# Patient Record
Sex: Female | Born: 1955 | Race: White | Hispanic: No | Marital: Single | Smoking: Never smoker
Health system: Southern US, Community
[De-identification: ages and names within clinical notes are randomized; demographics above are authoritative.]

## PROBLEM LIST (undated history)

## (undated) DIAGNOSIS — I10 Essential (primary) hypertension: Secondary | ICD-10-CM

## (undated) DIAGNOSIS — G43909 Migraine, unspecified, not intractable, without status migrainosus: Secondary | ICD-10-CM

## (undated) HISTORY — PX: OTHER SURGICAL HISTORY: SHX169

## (undated) HISTORY — PX: TUBAL LIGATION: SHX77

---

## 2014-11-22 ENCOUNTER — Emergency Department (HOSPITAL_COMMUNITY)
Admission: EM | Admit: 2014-11-22 | Discharge: 2014-11-22 | Disposition: A | Discharge status: Home / Self Care | Payer: PRIVATE HEALTH INSURANCE | Attending: Emergency Medicine | Admitting: Emergency Medicine

## 2014-11-22 ENCOUNTER — Emergency Department (HOSPITAL_COMMUNITY): Payer: PRIVATE HEALTH INSURANCE

## 2014-11-22 ENCOUNTER — Encounter (HOSPITAL_COMMUNITY): Payer: Self-pay | Admitting: Emergency Medicine

## 2014-11-22 DIAGNOSIS — R109 Unspecified abdominal pain: Secondary | ICD-10-CM | POA: Diagnosis not present

## 2014-11-22 DIAGNOSIS — Z7982 Long term (current) use of aspirin: Secondary | ICD-10-CM | POA: Diagnosis not present

## 2014-11-22 DIAGNOSIS — H9202 Otalgia, left ear: Secondary | ICD-10-CM | POA: Insufficient documentation

## 2014-11-22 DIAGNOSIS — I10 Essential (primary) hypertension: Secondary | ICD-10-CM | POA: Insufficient documentation

## 2014-11-22 DIAGNOSIS — M2550 Pain in unspecified joint: Secondary | ICD-10-CM | POA: Diagnosis not present

## 2014-11-22 DIAGNOSIS — Z905 Acquired absence of kidney: Secondary | ICD-10-CM | POA: Insufficient documentation

## 2014-11-22 DIAGNOSIS — R110 Nausea: Secondary | ICD-10-CM | POA: Insufficient documentation

## 2014-11-22 DIAGNOSIS — G43909 Migraine, unspecified, not intractable, without status migrainosus: Secondary | ICD-10-CM | POA: Diagnosis present

## 2014-11-22 DIAGNOSIS — R42 Dizziness and giddiness: Secondary | ICD-10-CM | POA: Diagnosis not present

## 2014-11-22 DIAGNOSIS — Z85528 Personal history of other malignant neoplasm of kidney: Secondary | ICD-10-CM | POA: Insufficient documentation

## 2014-11-22 DIAGNOSIS — M7989 Other specified soft tissue disorders: Secondary | ICD-10-CM | POA: Diagnosis not present

## 2014-11-22 DIAGNOSIS — R197 Diarrhea, unspecified: Secondary | ICD-10-CM | POA: Diagnosis not present

## 2014-11-22 DIAGNOSIS — Z79899 Other long term (current) drug therapy: Secondary | ICD-10-CM | POA: Insufficient documentation

## 2014-11-22 HISTORY — DX: Migraine, unspecified, not intractable, without status migrainosus: G43.909

## 2014-11-22 HISTORY — DX: Essential (primary) hypertension: I10

## 2014-11-22 LAB — BASIC METABOLIC PANEL
Anion gap: 10 (ref 5–15)
BUN: 19 mg/dL (ref 6–20)
CO2: 26 mmol/L (ref 22–32)
Calcium: 9.3 mg/dL (ref 8.9–10.3)
Chloride: 105 mmol/L (ref 101–111)
Creatinine, Ser: 1.1 mg/dL — ABNORMAL HIGH (ref 0.44–1.00)
GFR calc Af Amer: >60 mL/min (ref >60)
GFR calc non Af Amer: 54 mL/min — ABNORMAL LOW (ref >60)
Glucose, Bld: 100 mg/dL — ABNORMAL HIGH (ref 65–99)
Potassium: 4.1 mmol/L (ref 3.5–5.1)
Sodium: 141 mmol/L (ref 135–145)

## 2014-11-22 LAB — I-STAT TROPONIN, ED: Troponin i, poc: 0.01 ng/mL (ref 0.00–0.08)

## 2014-11-22 LAB — CBC
HCT: 42.7 % (ref 36.0–46.0)
Hemoglobin: 13.3 g/dL (ref 12.0–15.0)
MCH: 26.9 pg (ref 26.0–34.0)
MCHC: 31.1 g/dL (ref 30.0–36.0)
MCV: 86.4 fL (ref 78.0–100.0)
Platelets: 287 10*3/uL (ref 150–400)
RBC: 4.94 MIL/uL (ref 3.87–5.11)
RDW: 15.6 % — ABNORMAL HIGH (ref 11.5–15.5)
WBC: 10 10*3/uL (ref 4.0–10.5)

## 2014-11-22 LAB — CBG MONITORING, ED: Glucose-Capillary: 107 mg/dL — ABNORMAL HIGH (ref 65–99)

## 2014-11-22 MED ORDER — METOCLOPRAMIDE HCL 10 MG PO TABS: 10.0000 mg | ORAL_TABLET | Freq: Four times a day (QID) | ORAL | Status: AC | PRN

## 2014-11-22 MED ORDER — DIPHENHYDRAMINE HCL 25 MG PO CAPS: 25.0000 mg | ORAL_CAPSULE | Freq: Four times a day (QID) | ORAL | Status: AC | PRN

## 2014-11-22 MED ORDER — DIPHENHYDRAMINE HCL 25 MG PO CAPS
25.0000 mg | ORAL_CAPSULE | Freq: Once | ORAL | Status: AC
  Administered 2014-11-22: 25 mg via ORAL
  Filled 2014-11-22: qty 1

## 2014-11-22 MED ORDER — METOCLOPRAMIDE HCL 5 MG/ML IJ SOLN
10.0000 mg | Freq: Once | INTRAMUSCULAR | Status: AC
  Administered 2014-11-22: 10 mg via INTRAMUSCULAR
  Filled 2014-11-22: qty 2

## 2014-11-22 MED ORDER — KETOROLAC TROMETHAMINE 30 MG/ML IJ SOLN
30.0000 mg | Freq: Once | INTRAMUSCULAR | Status: AC
  Administered 2014-11-22: 30 mg via INTRAMUSCULAR
  Filled 2014-11-22: qty 1

## 2014-11-22 NOTE — Discharge Instructions (Signed)
Headaches, Frequently Asked Questions °MIGRAINE HEADACHES °Q: What is migraine? What causes it? How can I treat it? °A: Generally, migraine headaches begin as a dull ache. Then they develop into a constant, throbbing, and pulsating pain. You may experience pain at the temples. You may experience pain at the front or back of one or both sides of the head. The pain is usually accompanied by a combination of: °· Nausea. °· Vomiting. °· Sensitivity to light and noise. °Some people (about 15%) experience an aura (see below) before an attack. The cause of migraine is believed to be chemical reactions in the brain. Treatment for migraine may include over-the-counter or prescription medications. It may also include self-help techniques. These include relaxation training and biofeedback.  °Q: What is an aura? °A: About 15% of people with migraine get an "aura". This is a sign of neurological symptoms that occur before a migraine headache. You may see wavy or jagged lines, dots, or flashing lights. You might experience tunnel vision or blind spots in one or both eyes. The aura can include visual or auditory hallucinations (something imagined). It may include disruptions in smell (such as strange odors), taste or touch. Other symptoms include: °· Numbness. °· A "pins and needles" sensation. °· Difficulty in recalling or speaking the correct word. °These neurological events may last as long as 60 minutes. These symptoms will fade as the headache begins. °Q: What is a trigger? °A: Certain physical or environmental factors can lead to or "trigger" a migraine. These include: °· Foods. °· Hormonal changes. °· Weather. °· Stress. °It is important to remember that triggers are different for everyone. To help prevent migraine attacks, you need to figure out which triggers affect you. Keep a headache diary. This is a good way to track triggers. The diary will help you talk to your healthcare professional about your condition. °Q: Does  weather affect migraines? °A: Bright sunshine, hot, humid conditions, and drastic changes in barometric pressure may lead to, or "trigger," a migraine attack in some people. But studies have shown that weather does not act as a trigger for everyone with migraines. °Q: What is the link between migraine and hormones? °A: Hormones start and regulate many of your body's functions. Hormones keep your body in balance within a constantly changing environment. The levels of hormones in your body are unbalanced at times. Examples are during menstruation, pregnancy, or menopause. That can lead to a migraine attack. In fact, about three quarters of all women with migraine report that their attacks are related to the menstrual cycle.  °Q: Is there an increased risk of stroke for migraine sufferers? °A: The likelihood of a migraine attack causing a stroke is very remote. That is not to say that migraine sufferers cannot have a stroke associated with their migraines. In persons under age 40, the most common associated factor for stroke is migraine headache. But over the course of a person's normal life span, the occurrence of migraine headache may actually be associated with a reduced risk of dying from cerebrovascular disease due to stroke.  °Q: What are acute medications for migraine? °A: Acute medications are used to treat the pain of the headache after it has started. Examples over-the-counter medications, NSAIDs, ergots, and triptans.  °Q: What are the triptans? °A: Triptans are the newest class of abortive medications. They are specifically targeted to treat migraine. Triptans are vasoconstrictors. They moderate some chemical reactions in the brain. The triptans work on receptors in your brain. Triptans help   to restore the balance of a neurotransmitter called serotonin. Fluctuations in levels of serotonin are thought to be a main cause of migraine.  °Q: Are over-the-counter medications for migraine effective? °A:  Over-the-counter, or "OTC," medications may be effective in relieving mild to moderate pain and associated symptoms of migraine. But you should see your caregiver before beginning any treatment regimen for migraine.  °Q: What are preventive medications for migraine? °A: Preventive medications for migraine are sometimes referred to as "prophylactic" treatments. They are used to reduce the frequency, severity, and length of migraine attacks. Examples of preventive medications include antiepileptic medications, antidepressants, beta-blockers, calcium channel blockers, and NSAIDs (nonsteroidal anti-inflammatory drugs). °Q: Why are anticonvulsants used to treat migraine? °A: During the past few years, there has been an increased interest in antiepileptic drugs for the prevention of migraine. They are sometimes referred to as "anticonvulsants". Both epilepsy and migraine may be caused by similar reactions in the brain.  °Q: Why are antidepressants used to treat migraine? °A: Antidepressants are typically used to treat people with depression. They may reduce migraine frequency by regulating chemical levels, such as serotonin, in the brain.  °Q: What alternative therapies are used to treat migraine? °A: The term "alternative therapies" is often used to describe treatments considered outside the scope of conventional Western medicine. Examples of alternative therapy include acupuncture, acupressure, and yoga. Another common alternative treatment is herbal therapy. Some herbs are believed to relieve headache pain. Always discuss alternative therapies with your caregiver before proceeding. Some herbal products contain arsenic and other toxins. °TENSION HEADACHES °Q: What is a tension-type headache? What causes it? How can I treat it? °A: Tension-type headaches occur randomly. They are often the result of temporary stress, anxiety, fatigue, or anger. Symptoms include soreness in your temples, a tightening band-like sensation  around your head (a "vice-like" ache). Symptoms can also include a pulling feeling, pressure sensations, and contracting head and neck muscles. The headache begins in your forehead, temples, or the back of your head and neck. Treatment for tension-type headache may include over-the-counter or prescription medications. Treatment may also include self-help techniques such as relaxation training and biofeedback. °CLUSTER HEADACHES °Q: What is a cluster headache? What causes it? How can I treat it? °A: Cluster headache gets its name because the attacks come in groups. The pain arrives with little, if any, warning. It is usually on one side of the head. A tearing or bloodshot eye and a runny nose on the same side of the headache may also accompany the pain. Cluster headaches are believed to be caused by chemical reactions in the brain. They have been described as the most severe and intense of any headache type. Treatment for cluster headache includes prescription medication and oxygen. °SINUS HEADACHES °Q: What is a sinus headache? What causes it? How can I treat it? °A: When a cavity in the bones of the face and skull (a sinus) becomes inflamed, the inflammation will cause localized pain. This condition is usually the result of an allergic reaction, a tumor, or an infection. If your headache is caused by a sinus blockage, such as an infection, you will probably have a fever. An x-ray will confirm a sinus blockage. Your caregiver's treatment might include antibiotics for the infection, as well as antihistamines or decongestants.  °REBOUND HEADACHES °Q: What is a rebound headache? What causes it? How can I treat it? °A: A pattern of taking acute headache medications too often can lead to a condition known as "rebound headache."   A pattern of taking too much headache medication includes taking it more than 2 days per week or in excessive amounts. That means more than the label or a caregiver advises. With rebound  headaches, your medications not only stop relieving pain, they actually begin to cause headaches. Doctors treat rebound headache by tapering the medication that is being overused. Sometimes your caregiver will gradually substitute a different type of treatment or medication. Stopping may be a challenge. Regularly overusing a medication increases the potential for serious side effects. Consult a caregiver if you regularly use headache medications more than 2 days per week or more than the label advises. ADDITIONAL QUESTIONS AND ANSWERS Q: What is biofeedback? A: Biofeedback is a self-help treatment. Biofeedback uses special equipment to monitor your body's involuntary physical responses. Biofeedback monitors:  Breathing.  Pulse.  Heart rate.  Temperature.  Muscle tension.  Brain activity. Biofeedback helps you refine and perfect your relaxation exercises. You learn to control the physical responses that are related to stress. Once the technique has been mastered, you do not need the equipment any more. Q: Are headaches hereditary? A: Four out of five (80%) of people that suffer report a family history of migraine. Scientists are not sure if this is genetic or a family predisposition. Despite the uncertainty, a child has a 50% chance of having migraine if one parent suffers. The child has a 75% chance if both parents suffer.  Q: Can children get headaches? A: By the time they reach high school, most young people have experienced some type of headache. Many safe and effective approaches or medications can prevent a headache from occurring or stop it after it has begun.  Q: What type of doctor should I see to diagnose and treat my headache? A: Start with your primary caregiver. Discuss his or her experience and approach to headaches. Discuss methods of classification, diagnosis, and treatment. Your caregiver may decide to recommend you to a headache specialist, depending upon your symptoms or other  physical conditions. Having diabetes, allergies, etc., may require a more comprehensive and inclusive approach to your headache. The National Headache Foundation will provide, upon request, a list of [facility] physician members in your state. Document Released: 08/21/2003 Document Revised: 08/23/2011 Document Reviewed: 01/29/2008 [facility] Patient Information 2015 [place], [place]. This information is not intended to replace advice given to you by your health care provider. Make sure you discuss any questions you have with your health care provider.  Emergency Department Resource Guide 1) Find a Doctor and Pay Out of Pocket Although you won't have to find out who is covered by your insurance plan, it is a good idea to ask around and get recommendations. You will then need to call the office and see if the doctor you have chosen will accept you as a new patient and what types of options they offer for patients who are self-pay. Some doctors offer discounts or will set up payment plans for their patients who do not have insurance, but you will need to ask so you aren't surprised when you get to your appointment.  2) Contact Your Local Health Department Not all health departments have doctors that can see patients for sick visits, but many do, so it is worth a call to see if yours does. If you don't know where your local health department is, you can check in your phone book. The CDC also has a tool to help you locate your state's health department, and many state websites also have  listings of all of their local health departments.  3) Find a Walk-in Clinic If your illness is not likely to be very severe or complicated, you may want to try a walk in clinic. These are popping up all over the country in pharmacies, drugstores, and shopping centers. They're usually staffed by nurse practitioners or physician assistants that have been trained to treat common illnesses and complaints. They're usually fairly quick and  inexpensive. However, if you have serious medical issues or chronic medical problems, these are probably not your best option.  No Primary Care Doctor: - Call Health Connect at  [phone] - they can help you locate a primary care doctor that  accepts your insurance, provides certain services, etc. - Physician Referral Service- [phone]  Chronic Pain Problems: Organization         Address  Phone   Notes  [facility]  [phone] Patients need to be referred by their primary care doctor.   Medication Assistance: Organization         Address  Phone   Notes  [facility] Medication [facility] [address] [place] [place], [place] [address] [phone] --Must be a resident of [facility] -- Must have NO insurance coverage whatsoever (no Medicaid/ Medicare, etc.) -- The pt. MUST have a primary care doctor that directs their care regularly and follows them in the community   [facility]  [phone]   [employer]  [phone]    Agencies that provide inexpensive medical care: Organization         Address  Phone   Notes  [facility]  [phone]   [facility]    [phone]   [facility] [address] [place], [place] [address] [phone]   [facility] [address], [place] [phone]   [facility]    [phone]   [facility]    [phone]   [facility]  [address], [place] Phone:  [phone], Fax:  [phone] Hours of Operation:  9 am - 6 pm, M-F.  Also accepts Medicaid/Medicare and self-pay.  [facility]  [address], [place] Phone: [phone], Fax: [phone]. Hours of Operation:  8:30 am - 5:30 pm, M-F.  Also accepts Medicaid and self-pay.  [facility] [place] [address], [place] Phone: [phone]   [facility] [address] Natasha Bence [place], [place] [phone], Ext. [phone] Mondays & Thursdays: 7-9 AM.  First 15 patients are seen on a first come, first serve basis.    Medicaid-accepting [facility] Providers:  Organization         Address  Phone   Notes  [facility] [address], [place] [phone] Also accepts self-pay patients.  [facility] [address] Laurell Josephs [place], [place]  [phone]   [facility] [address], [place] [phone]   [facility] [address] [place] [phone]   Renaye Rakers [address], [place]   [phone] Only accepts [place] patients after they have their name applied to their card.   Self-Pay (no insurance) in [facility]:  [employer]  Notes  °Sickle Cell Patients, [facility] [address], [place] [phone]   °[facility] [address], [place] ([phone]) [phone]   °[facility] ° [address], [place] ([phone]) [phone]   °[facility]/Dr. Osei-Bonsu ° [address] [place] [address], [place] or [address], [place] [phone] Phone number for both [place] and [place] locations is the same.  °[facility] [address], [place] ([phone]) [phone]   °[facility] [address] [place] [address], [place] or [address] ([phone]) [phone] °([phone]) [phone]   °[facility] [address], [place] ([phone]) [phone], phone; [phone], fax Sees patients 1st and 3rd Saturday of every month.  Must not qualify for public or private insurance (i.e. Medicaid, Medicare, [place] Health Choice, Veterans' Benefits) • Household income should be no more than 200% of the poverty level •The clinic cannot treat you if you are pregnant or  think you are pregnant • Sexually transmitted diseases are not treated at the clinic.  ° ° °Dental Care: °Organization         Address  Phone  Notes  °[facility] [address], [place] ([phone]) [phone] Accepts children up to age 21 who are enrolled in Medicaid or [place] Health Choice; pregnant women with a Medicaid card; and children who have applied for Medicaid or [place] Health Choice, but were declined, whose parents can pay a reduced fee at time of service.  °[facility] [place]  [address], [place] ([phone]) [phone] Accepts children up to age 21 who are enrolled in Medicaid or [place] Health Choice; pregnant women with a Medicaid card; and children who have applied for Medicaid or [place] Health Choice, but were declined, whose parents can pay a reduced fee at time of service.  °[facility] ° [address], [place] ([phone]) [phone] Patients are seen by appointment only. Walk-ins are not accepted. [facility] will see patients 18 years of age and older. °Monday - Tuesday (8am-5pm) °Most Wednesdays (8:30-5pm) °$30 per visit, cash only  °[facility] ° [address], [place] ([phone]) [phone] Patients are seen by appointment only. Walk-ins are not accepted. [facility] will see patients 18 years of age and older. °One Wednesday Evening (Monthly: Volunteer Based).  $30 per visit, cash only  °[facility]  [phone] for adults; Children under age 4, call Graduate Pediatric Dentistry at [phone]. Children aged 4-14, please call [phone] to request a pediatric application. ° Dental services are provided in all areas of dental care including fillings, crowns and bridges, complete and partial dentures, implants, gum treatment, root canals, and extractions. Preventive care is also provided. Treatment is provided to both adults  and children. °Patients are selected via a lottery and there is often a waiting list. °  °[facility] [address], °[place] ° (336) 763-8833 [email or web address] °  °[facility] [address], [place], [place] ([phone], Ext. [phone] Second and Fourth Thursday of each month, opens at 6:30 AM; Clinic ends at 9 AM.  Patients are seen on a first-come first-served basis, and a limited number are seen during each clinic.  ° °[facility] ° [address], [place], [place] [phone]) [phone]     Eligibility Requirements °You must have lived in [place], [place], or [place] counties for at least the last three months. °  You cannot be eligible for state or federal sponsored healthcare insurance, including Veterans Administration, Medicaid, or Medicare. °  You generally cannot be eligible for healthcare insurance through your employer.  °  How to apply: °Eligibility screenings are held every Tuesday and Wednesday afternoon from 1:00 pm until 4:00 pm. You do not need an appointment for the interview!  °[facility] [address], [place], [place] [phone]   °[facility]  336-342-8273   °[facility]  336-703-3100   °[facility]  336-570-6415   ° °Behavioral Health Resources in the Community: °Intensive Outpatient Programs °Organization         Address  Phone  Notes  °[facility] [address], [place], [place] [phone]   °[facility] [address], [place], [place] [phone]   °[facility] [address], [place], [place] ° 336-882-2125   °[facility] [address],  °[place], [place] 1-800-853-5163 or 336-641-4981   °Substance Abuse Resources °Organization         Address  Phone  Notes  °[facility]  336-882-2125   °[facility]  336-784-9470   °[facility]  336-285-9073     °[facility]  336-845-3988   °Residential & Outpatient Substance Abuse Program  1-800-659-3381   °Psychological Services °Organization         Address  Phone  Notes  °[facility]  336- 832-9600   °[facility]  336- 378-7881   °[facility] [address], [place] 1-800-853-5163 or 336-641-4981   ° °Mobile Crisis Teams °Organization         Address  Phone  Notes  °[facility], Mobile Crisis Care Unit  1-877-626-1772   °Assertive °[facility] ° [address] [place], [place] 336-834-9664   °Sharon DeEsch [address] °[place] 336-554-5454   ° °Self-Help/Support Groups °Organization         Address  Phone             Notes  °[facility] [place] - variety of support groups  336- 373-1402 Call for more information  °Narcotics Anonymous (NA), [facility] [address], °[place]  2 meetings at this location  ° °Residential Treatment Programs °Organization         Address  Phone  Notes  °[facility] [address],    °[place]  1-866-801-8205   °[facility] ° [address], [place], [place] 704-293-8524   °[facility] [address], [place] 336-845-3988 Admissions: 8am-3pm M-F  °[facility] [address]    °[place], [place] 336-841-1104   °[facility] [address], [place], [place] 336-379-7146   °[facility] [address]  °[place], [place] 336-285-9073   °[facility] - Intensive Outpatient [address], [place], [place] 336-852-3033   °[facility] [address]  °[place], [place] 1-877-615-2722 or 336-784-9470   °[facility] ([facility]) [address] [place], [place] 336-227-7417 Accepts Medicaid  °[facility] [address]  ° [place] 1-800-659-3381 Substance Abuse/Addiction Treatment  ° °[place]  Behavioral Health Resources °Organization           Address  Phone  Notes  [facility]  671-297-6584   Angie Fava, PhD [address] Ervin Knack [place], [place]   8604726317 or ([phone]   [facility]   [address] [place], [place] ([phone]   [facility] [address], [place], [place] [phone] Insurance/Medicaid/sponsorship through [facility] [address]                                    [place], [place] [phone] Therapy/tele-psych/case  [facility] [address], [place] [phone]    Dr. Lolly Mustache  [phone]   [facility]  [employer] [facility] Dept. 1) [address], [place] 2) [address] [place] 3)  [address], [place] [phone]  [phone]   [facility] Child Abuse Hotline 306-391-1769 or (302)[phone] (After Hours)     It is essential that you get a primary care physician to help manage your multiple medical conditions

## 2014-11-22 NOTE — ED Notes (Addendum)
Pt reports headache, bilateral ear pain, dizziness, nausea, and left arm pain for a month. Pt denies CP or SOB. Pt tearful upon assessment.

## 2014-11-22 NOTE — ED Provider Notes (Signed)
CSN: [id number]     Arrival date & time 11/22/14  1804 History  This chart was scribed for Earley Favor, NP working with Raeford Razor, MD by Evon Slack, ED Scribe. This patient was seen in room WTR9/WTR9 and the patient's care was started at 8:42 PM.      Chief Complaint  Patient presents with  . Headache  . Otalgia  . Arm Pain   HPI HPI Comments: Ashlee Alexander is a 59 y.o. female who presents to the Emergency Department complaining of intermittent left sided HA onset 1 week prior. Pt states she has associated nausea, left sided ear pain dizziness and waves in her vision. Pt states she tried Maxalt and ibuprofen with no relief.  Has Hx of Migraines, cancer with mets  Last exam 1 year ago cancer free  Reports HX of kidney cancer. Pt states sheHx left kidney removal. Pt reports associated left sided abdominal pani with associated diarrhea. Pt states she has a Hx of migraines as well.    Pt is also complaining of left foot swelling.  Pt is complaining of left elbow pain as well. Pt states that she has relief with an essential oil.    Past Medical History  Diagnosis Date  . Migraine   . Hypertension    Past Surgical History  Procedure Laterality Date  . Left kidney removal    . Tubal ligation     No family history on file. History  Substance Use Topics  . Smoking status: Never Smoker   . Smokeless tobacco: Not on file  . Alcohol Use: No   OB History    No data available      Review of Systems  HENT: Positive for ear pain.   Eyes: Positive for photophobia and visual disturbance.  Respiratory: Negative for shortness of breath.   Cardiovascular: Positive for leg swelling.  Gastrointestinal: Positive for nausea, abdominal pain and diarrhea. Negative for vomiting.  Musculoskeletal: Positive for arthralgias.  Neurological: Positive for dizziness and headaches.  All other systems reviewed and are negative.    Allergies  Tetracyclines & related  Home Medications    Prior to Admission medications   Medication Sig Start Date End Date Taking? Authorizing Provider  aspirin EC 81 MG tablet Take 81 mg by mouth daily.   Yes Historical Provider, MD  cetirizine (ZYRTEC) 10 MG tablet Take 10 mg by mouth daily.   Yes Historical Provider, MD  omeprazole (PRILOSEC) 20 MG capsule Take 20 mg by mouth daily.   Yes Historical Provider, MD  PARoxetine (PAXIL) 20 MG tablet Take 20 mg by mouth daily.   Yes Historical Provider, MD  diphenhydrAMINE (BENADRYL) 25 mg capsule Take 1 capsule (25 mg total) by mouth every 6 (six) hours as needed. 11/22/14   Earley Favor, NP  metoCLOPramide (REGLAN) 10 MG tablet Take 1 tablet (10 mg total) by mouth every 6 (six) hours as needed for nausea. 11/22/14   Earley Favor, NP   BP 183/96 mmHg  Pulse 86  Temp(Src) 97.8 F (36.6 C) (Oral)  Resp 18  SpO2 96%   Physical Exam  Constitutional: She is oriented to person, place, and time. She appears well-developed and well-nourished. No distress.  HENT:  Head: Normocephalic and atraumatic.  Right Ear: External ear normal.  Left Ear: External ear normal.  Eyes: Conjunctivae and EOM are normal. Pupils are equal, round, and reactive to light. Right eye exhibits no nystagmus. Left eye exhibits no nystagmus.  Photophobia.   Neck: Neck supple.  No tracheal deviation present.  Cardiovascular: Normal rate.   Pulmonary/Chest: Effort normal. No respiratory distress.  Musculoskeletal: Normal range of motion.  Neurological: She is alert and oriented to person, place, and time.  Skin: Skin is warm and dry.  Psychiatric: She has a normal mood and affect. Her behavior is normal.  Nursing note and vitals reviewed.   ED Course  Procedures (including critical care time) DIAGNOSTIC STUDIES: Oxygen Saturation is 100% on RA, normal by my interpretation.    COORDINATION OF CARE: 9:03 PM-Discussed treatment plan with pt at bedside and pt agreed to plan.     Labs Review Labs Reviewed  CBC - Abnormal;  Notable for the following:    RDW 15.6 (*)    All other components within normal limits  BASIC METABOLIC PANEL - Abnormal; Notable for the following:    Glucose, Bld 100 (*)    Creatinine, Ser 1.10 (*)    GFR calc non Af Amer 54 (*)    All other components within normal limits  CBG MONITORING, ED - Abnormal; Notable for the following:    Glucose-Capillary 107 (*)    All other components within normal limits  I-STAT TROPOININ, ED    Imaging Review No results found.   EKG Interpretation None     Patient refused CT at this time stating time constrants  MDM   Final diagnoses:  Migraine  Essential hypertension      I personally performed the services described in this documentation, which was scribed in my presence. The recorded information has been reviewed and is accurate.     Earley Favor, NP 11/22/14 2224  Earley Favor, NP 11/22/14 4403  Raeford Razor, MD 11/23/14 2253

## 2016-01-06 ENCOUNTER — Ambulatory Visit (INDEPENDENT_AMBULATORY_CARE_PROVIDER_SITE_OTHER): Payer: PRIVATE HEALTH INSURANCE | Admitting: Licensed Clinical Social Worker

## 2016-01-06 DIAGNOSIS — F331 Major depressive disorder, recurrent, moderate: Secondary | ICD-10-CM

## 2016-01-27 ENCOUNTER — Ambulatory Visit (INDEPENDENT_AMBULATORY_CARE_PROVIDER_SITE_OTHER): Payer: PRIVATE HEALTH INSURANCE | Admitting: Licensed Clinical Social Worker

## 2016-01-27 DIAGNOSIS — F3341 Major depressive disorder, recurrent, in partial remission: Secondary | ICD-10-CM

## 2016-03-16 ENCOUNTER — Ambulatory Visit: Payer: PRIVATE HEALTH INSURANCE | Admitting: Licensed Clinical Social Worker

## 2016-06-15 ENCOUNTER — Ambulatory Visit (INDEPENDENT_AMBULATORY_CARE_PROVIDER_SITE_OTHER): Payer: PRIVATE HEALTH INSURANCE | Admitting: Licensed Clinical Social Worker

## 2016-06-15 DIAGNOSIS — F3341 Major depressive disorder, recurrent, in partial remission: Secondary | ICD-10-CM

## 2016-07-07 ENCOUNTER — Ambulatory Visit: Payer: Self-pay | Admitting: Licensed Clinical Social Worker

## 2016-07-08 ENCOUNTER — Ambulatory Visit: Payer: Self-pay | Admitting: Licensed Clinical Social Worker

## 2016-08-24 ENCOUNTER — Ambulatory Visit (INDEPENDENT_AMBULATORY_CARE_PROVIDER_SITE_OTHER): Payer: PRIVATE HEALTH INSURANCE | Admitting: Licensed Clinical Social Worker

## 2016-08-24 DIAGNOSIS — F3341 Major depressive disorder, recurrent, in partial remission: Secondary | ICD-10-CM | POA: Diagnosis not present

## 2016-09-21 ENCOUNTER — Ambulatory Visit (INDEPENDENT_AMBULATORY_CARE_PROVIDER_SITE_OTHER): Payer: PRIVATE HEALTH INSURANCE | Admitting: Licensed Clinical Social Worker

## 2016-09-21 DIAGNOSIS — F3341 Major depressive disorder, recurrent, in partial remission: Secondary | ICD-10-CM | POA: Diagnosis not present

## 2016-09-28 ENCOUNTER — Ambulatory Visit (INDEPENDENT_AMBULATORY_CARE_PROVIDER_SITE_OTHER): Payer: PRIVATE HEALTH INSURANCE | Admitting: Licensed Clinical Social Worker

## 2016-09-28 DIAGNOSIS — F3341 Major depressive disorder, recurrent, in partial remission: Secondary | ICD-10-CM

## 2016-10-12 ENCOUNTER — Ambulatory Visit (INDEPENDENT_AMBULATORY_CARE_PROVIDER_SITE_OTHER): Payer: PRIVATE HEALTH INSURANCE | Admitting: Licensed Clinical Social Worker

## 2016-10-12 DIAGNOSIS — F3341 Major depressive disorder, recurrent, in partial remission: Secondary | ICD-10-CM

## 2016-10-19 ENCOUNTER — Ambulatory Visit: Payer: PRIVATE HEALTH INSURANCE | Admitting: Licensed Clinical Social Worker

## 2016-10-19 ENCOUNTER — Ambulatory Visit (INDEPENDENT_AMBULATORY_CARE_PROVIDER_SITE_OTHER): Payer: PRIVATE HEALTH INSURANCE | Admitting: Licensed Clinical Social Worker

## 2016-10-19 DIAGNOSIS — F3341 Major depressive disorder, recurrent, in partial remission: Secondary | ICD-10-CM | POA: Diagnosis not present

## 2016-11-11 ENCOUNTER — Ambulatory Visit (INDEPENDENT_AMBULATORY_CARE_PROVIDER_SITE_OTHER): Payer: PRIVATE HEALTH INSURANCE | Admitting: Licensed Clinical Social Worker

## 2016-11-11 DIAGNOSIS — F3341 Major depressive disorder, recurrent, in partial remission: Secondary | ICD-10-CM

## 2016-11-25 ENCOUNTER — Ambulatory Visit (INDEPENDENT_AMBULATORY_CARE_PROVIDER_SITE_OTHER): Payer: PRIVATE HEALTH INSURANCE | Admitting: Licensed Clinical Social Worker

## 2016-11-25 DIAGNOSIS — F3341 Major depressive disorder, recurrent, in partial remission: Secondary | ICD-10-CM | POA: Diagnosis not present

## 2016-12-14 ENCOUNTER — Ambulatory Visit (INDEPENDENT_AMBULATORY_CARE_PROVIDER_SITE_OTHER): Payer: PRIVATE HEALTH INSURANCE | Admitting: Licensed Clinical Social Worker

## 2016-12-14 DIAGNOSIS — F3341 Major depressive disorder, recurrent, in partial remission: Secondary | ICD-10-CM

## 2017-01-11 ENCOUNTER — Ambulatory Visit: Payer: Self-pay | Admitting: Licensed Clinical Social Worker

## 2017-03-29 ENCOUNTER — Ambulatory Visit (INDEPENDENT_AMBULATORY_CARE_PROVIDER_SITE_OTHER): Payer: PRIVATE HEALTH INSURANCE | Admitting: Licensed Clinical Social Worker

## 2017-03-29 DIAGNOSIS — F3341 Major depressive disorder, recurrent, in partial remission: Secondary | ICD-10-CM

## 2017-04-05 ENCOUNTER — Ambulatory Visit (INDEPENDENT_AMBULATORY_CARE_PROVIDER_SITE_OTHER): Payer: PRIVATE HEALTH INSURANCE | Admitting: Licensed Clinical Social Worker

## 2017-04-05 DIAGNOSIS — F3341 Major depressive disorder, recurrent, in partial remission: Secondary | ICD-10-CM

## 2017-04-12 ENCOUNTER — Ambulatory Visit (INDEPENDENT_AMBULATORY_CARE_PROVIDER_SITE_OTHER): Payer: PRIVATE HEALTH INSURANCE | Admitting: Licensed Clinical Social Worker

## 2017-04-12 DIAGNOSIS — F3341 Major depressive disorder, recurrent, in partial remission: Secondary | ICD-10-CM

## 2017-04-17 ENCOUNTER — Emergency Department (HOSPITAL_COMMUNITY)
Admission: EM | Admit: 2017-04-17 | Discharge: 2017-04-17 | Disposition: A | Discharge status: Home / Self Care | Payer: PRIVATE HEALTH INSURANCE | Attending: Emergency Medicine | Admitting: Emergency Medicine

## 2017-04-17 ENCOUNTER — Emergency Department (HOSPITAL_COMMUNITY): Payer: PRIVATE HEALTH INSURANCE

## 2017-04-17 ENCOUNTER — Encounter (HOSPITAL_COMMUNITY): Payer: Self-pay | Admitting: Emergency Medicine

## 2017-04-17 DIAGNOSIS — H53411 Scotoma involving central area, right eye: Secondary | ICD-10-CM | POA: Diagnosis not present

## 2017-04-17 DIAGNOSIS — G8929 Other chronic pain: Secondary | ICD-10-CM | POA: Insufficient documentation

## 2017-04-17 DIAGNOSIS — Z7982 Long term (current) use of aspirin: Secondary | ICD-10-CM | POA: Diagnosis not present

## 2017-04-17 DIAGNOSIS — G43109 Migraine with aura, not intractable, without status migrainosus: Secondary | ICD-10-CM

## 2017-04-17 DIAGNOSIS — M545 Low back pain, unspecified: Secondary | ICD-10-CM

## 2017-04-17 DIAGNOSIS — Z79899 Other long term (current) drug therapy: Secondary | ICD-10-CM | POA: Insufficient documentation

## 2017-04-17 DIAGNOSIS — R110 Nausea: Secondary | ICD-10-CM | POA: Diagnosis not present

## 2017-04-17 DIAGNOSIS — I10 Essential (primary) hypertension: Secondary | ICD-10-CM | POA: Insufficient documentation

## 2017-04-17 LAB — CBC WITH DIFFERENTIAL/PLATELET
Basophils Absolute: 0.1 10*3/uL (ref 0.0–0.1)
Basophils Relative: 1 %
Eosinophils Absolute: 0.3 10*3/uL (ref 0.0–0.7)
Eosinophils Relative: 3 %
HEMATOCRIT: 44.6 % (ref 36.0–46.0)
Hemoglobin: 14.4 g/dL (ref 12.0–15.0)
LYMPHS PCT: 35 %
Lymphs Abs: 3.8 10*3/uL (ref 0.7–4.0)
MCH: 28.2 pg (ref 26.0–34.0)
MCHC: 32.3 g/dL (ref 30.0–36.0)
MCV: 87.3 fL (ref 78.0–100.0)
MONOS PCT: 6 %
Monocytes Absolute: 0.7 10*3/uL (ref 0.1–1.0)
NEUTROS ABS: 6 10*3/uL (ref 1.7–7.7)
Neutrophils Relative %: 55 %
Platelets: 246 10*3/uL (ref 150–400)
RBC: 5.11 MIL/uL (ref 3.87–5.11)
RDW: 15.6 % — AB (ref 11.5–15.5)
WBC: 10.8 10*3/uL — ABNORMAL HIGH (ref 4.0–10.5)

## 2017-04-17 LAB — URINALYSIS, ROUTINE W REFLEX MICROSCOPIC
Bilirubin Urine: NEGATIVE
Glucose, UA: NEGATIVE mg/dL
Hgb urine dipstick: NEGATIVE
KETONES UR: NEGATIVE mg/dL
Nitrite: NEGATIVE
Protein, ur: NEGATIVE mg/dL
Specific Gravity, Urine: 1.016 (ref 1.005–1.030)
pH: 5 (ref 5.0–8.0)

## 2017-04-17 LAB — BASIC METABOLIC PANEL
Anion gap: 9 (ref 5–15)
BUN: 15 mg/dL (ref 6–20)
CALCIUM: 9.3 mg/dL (ref 8.9–10.3)
CO2: 27 mmol/L (ref 22–32)
CREATININE: 1.12 mg/dL — AB (ref 0.44–1.00)
Chloride: 103 mmol/L (ref 101–111)
GFR calc Af Amer: >60 mL/min (ref >60)
GFR calc non Af Amer: 52 mL/min — ABNORMAL LOW (ref >60)
GLUCOSE: 91 mg/dL (ref 65–99)
Potassium: 3.2 mmol/L — ABNORMAL LOW (ref 3.5–5.1)
Sodium: 139 mmol/L (ref 135–145)

## 2017-04-17 MED ORDER — DEXAMETHASONE SODIUM PHOSPHATE 10 MG/ML IJ SOLN
10.0000 mg | Freq: Once | INTRAMUSCULAR | Status: AC
  Administered 2017-04-17: 10 mg via INTRAVENOUS
  Filled 2017-04-17: qty 1

## 2017-04-17 MED ORDER — SODIUM CHLORIDE 0.9 % IV BOLUS (SEPSIS)
1000.0000 mL | Freq: Once | INTRAVENOUS | Status: AC
  Administered 2017-04-17: 1000 mL via INTRAVENOUS

## 2017-04-17 MED ORDER — PROCHLORPERAZINE EDISYLATE 5 MG/ML IJ SOLN
10.0000 mg | Freq: Once | INTRAMUSCULAR | Status: AC
  Administered 2017-04-17: 10 mg via INTRAVENOUS
  Filled 2017-04-17: qty 2

## 2017-04-17 MED ORDER — DIPHENHYDRAMINE HCL 50 MG/ML IJ SOLN
25.0000 mg | Freq: Once | INTRAMUSCULAR | Status: AC
  Administered 2017-04-17: 25 mg via INTRAVENOUS
  Filled 2017-04-17: qty 1

## 2017-04-17 NOTE — Discharge Instructions (Signed)
Your labs and imaging study today are normal (no evidence for low back injury).  I suspect your headache and vision change is from an atypical migraine which can cause vision changes.  Get rechecked for any return or worsening of your symptoms.

## 2017-04-17 NOTE — ED Provider Notes (Signed)
[facility] EMERGENCY DEPARTMENT Provider Note   CSN: [id number] Arrival date & time: 04/17/17  1048     History   Chief Complaint Chief Complaint  Patient presents with  . Hip Pain  . Eye Problem    HPI Ashlee Alexander is a 61 y.o. female with a history of hypertension, migraine headaches, occasional low back pain and a history of left kidney cancer resolved with left nephrectomy over 3 years ago presenting with a 4-day history of right lower back pain.  She denies injury, simply woke up 4 mornings ago with increased pain in her right lower back which is worsened with movement and better at rest.  There is no radiation of pain, she denies urinary or fecal incontinence or retention and is has no lower extremity pain, numbness or weakness.  Yesterday she developed a right sided headache with associated intermittent flashes of light in her right field of vision.  These flashes will last a brief seconds, she noticed one episode last evening, 2 this morning, and one more since arriving here.  She reports her headache is similar to prior migraine headaches, although denies history of visual scotoma historically.  She has had nausea without emesis, she denies focal weakness, dizziness, neck pain or stiffness, fevers or chills.  The history is provided by the patient.    Past Medical History:  Diagnosis Date  . Hypertension   . Migraine     There are no active problems to display for this patient.   Past Surgical History:  Procedure Laterality Date  . left kidney removal    . TUBAL LIGATION      OB History    No data available       Home Medications    Prior to Admission medications   Medication Sig Start Date End Date Taking? Authorizing Provider  aspirin EC 81 MG tablet Take 81 mg by mouth daily.    [provider]  cetirizine (ZYRTEC) 10 MG tablet Take 10 mg by mouth daily.    [provider]  diphenhydrAMINE (BENADRYL) 25 mg capsule  Take 1 capsule (25 mg total) by mouth every 6 (six) hours as needed. 11/22/14   Earley Favor, NP  metoCLOPramide (REGLAN) 10 MG tablet Take 1 tablet (10 mg total) by mouth every 6 (six) hours as needed for nausea. 11/22/14   Earley Favor, NP  omeprazole (PRILOSEC) 20 MG capsule Take 20 mg by mouth daily.    [provider]  PARoxetine (PAXIL) 20 MG tablet Take 20 mg by mouth daily.    [provider]    Family History No family history on file.  Social History Social History   Tobacco Use  . Smoking status: Never Smoker  . Smokeless tobacco: Never Used  Substance Use Topics  . Alcohol use: No  . Drug use: No     Allergies   Tetracyclines & related   Review of Systems Review of Systems  Constitutional: Negative for fever.  HENT: Negative.   Eyes: Positive for visual disturbance.  Respiratory: Negative for shortness of breath.   Cardiovascular: Negative for chest pain and leg swelling.  Gastrointestinal: Negative for abdominal distention, abdominal pain and constipation.  Genitourinary: Negative for difficulty urinating, dysuria, flank pain, frequency and urgency.  Musculoskeletal: Positive for back pain. Negative for gait problem, joint swelling and neck pain.  Skin: Negative for rash.  Neurological: Positive for headaches. Negative for weakness and numbness.     Physical Exam Updated Vital  Signs BP 107/71   Pulse 63   Temp 98.5 F (36.9 C)   Resp 16   Ht 5' (1.524 m)   Wt 101.2 kg (223 lb)   SpO2 99%   BMI 43.55 kg/m   Physical Exam  Constitutional: She is oriented to person, place, and time. She appears well-developed and well-nourished.  No acute distress.  Appears comfortable sitting in a darkened room.  HENT:  Head: Normocephalic and atraumatic.  Mouth/Throat: Oropharynx is clear and moist.  Eyes: Conjunctivae and EOM are normal. Pupils are equal, round, and reactive to light.  Fundoscopic exam:      The right eye shows no papilledema.  The right eye shows red reflex. The right eye shows no venous pulsations.       The left eye shows no papilledema. The left eye shows red reflex.  Neck: Normal range of motion. Neck supple.  Cardiovascular: Normal rate, normal heart sounds and intact distal pulses.  Pedal pulses normal.  Pulmonary/Chest: Effort normal.  Abdominal: Soft. Bowel sounds are normal. She exhibits no distension and no mass. There is no tenderness.  Musculoskeletal: Normal range of motion. She exhibits no edema.       Lumbar back: She exhibits tenderness. She exhibits no swelling, no edema and no spasm.  Right paralumbar tenderness to palpation  Lymphadenopathy:    She has no cervical adenopathy.  Neurological: She is alert and oriented to person, place, and time. She has normal strength. She displays no atrophy and no tremor. No sensory deficit. Gait normal. GCS eye subscore is 4. GCS verbal subscore is 5. GCS motor subscore is 6.  Reflex Scores:      Patellar reflexes are 2+ on the right side and 2+ on the left side.      Achilles reflexes are 2+ on the right side and 2+ on the left side. No strength deficit noted in hip and knee flexor and extensor muscle groups.  Ankle flexion and extension intact.  Skin: Skin is warm and dry. No rash noted.  Psychiatric: She has a normal mood and affect. Her speech is normal and behavior is normal. Thought content normal. Cognition and memory are normal.  Nursing note and vitals reviewed.    ED Treatments / Results  Labs (all labs ordered are listed, but only abnormal results are displayed) Labs Reviewed  BASIC METABOLIC PANEL - Abnormal; Notable for the following components:      Result Value   Potassium 3.2 (*)    Creatinine, Ser 1.12 (*)    GFR calc non Af Amer 52 (*)    All other components within normal limits  URINALYSIS, ROUTINE W REFLEX MICROSCOPIC - Abnormal; Notable for the following components:   Leukocytes, UA MODERATE (*)    Bacteria, UA RARE (*)     Squamous Epithelial / LPF 0-5 (*)    All other components within normal limits  CBC WITH DIFFERENTIAL/PLATELET - Abnormal; Notable for the following components:   WBC 10.8 (*)    RDW 15.6 (*)    All other components within normal limits    EKG  EKG Interpretation None       Radiology Dg Lumbar Spine Complete  Result Date: 04/17/2017 CLINICAL DATA:  Low back pain.  Denies injury. EXAM: LUMBAR SPINE - COMPLETE 4+ VIEW COMPARISON:  None. FINDINGS: Lumbar spine is transitional. Imaging of the thoracic spine will be required for proper labeling. Be low the last lower thoracic vertebral body with large ribs, there is a  vertebral body with a rudimentary left-sided rib. At the lumbosacral junction, a lumbarized vertebral body is fused with the sacrum. There is no vertebral compression deformity. There is 4 mm anterolisthesis at the lowest complete disc. There is also considerable facet arthropathy at the lumbosacral junction. No definite acute fracture. IMPRESSION: No acute bony pathology. Degenerative change. Transitional anatomy as described. Electronically Signed   By: Jolaine ClickArthur  Hoss M.D.   On: 04/17/2017 14:51    Procedures Procedures (including critical care time)  Medications Ordered in ED Medications  sodium chloride 0.9 % bolus 1,000 mL (0 mLs Intravenous Stopped 04/17/17 1504)  prochlorperazine (COMPAZINE) injection 10 mg (10 mg Intravenous Given 04/17/17 1328)  diphenhydrAMINE (BENADRYL) injection 25 mg (25 mg Intravenous Given 04/17/17 1328)  dexamethasone (DECADRON) injection 10 mg (10 mg Intravenous Given 04/17/17 1328)     Initial Impression / Assessment and Plan / ED Course  I have reviewed the triage vital signs and the nursing notes.  Pertinent labs & imaging results that were available during my care of the patient were reviewed by me and considered in my medical decision making (see chart for details).     Patient was given IV fluids along with migraine cocktail with  complete resolution of her symptoms including visual scotoma.   She was encouraged as needed follow-up for any return of symptoms and was given referral numbers for obtaining a local PCP.  PRN follow-up anticipated.  Patient was seen by Dr. Adriana Simasook during this ED visit.   Final Clinical Impressions(s) / ED Diagnoses   Final diagnoses:  Migraine with aura and without status migrainosus, not intractable  Chronic right-sided low back pain without sciatica    New Prescriptions This SmartLink is deprecated. Use AVSMEDLIST instead to display the medication list for a patient.   Burgess Amordol, Phyllis Abelson, PA-C 04/17/17 1544    Donnetta Hutchingook, Brian, MD 04/19/17 [id number]

## 2017-04-17 NOTE — ED Triage Notes (Signed)
Pt. Stated, Im having rt, hip pain and it goes up my back and I think that has made my vision different.  I m having flashes in my right eye since yesterday off and on.

## 2017-04-19 ENCOUNTER — Ambulatory Visit: Payer: PRIVATE HEALTH INSURANCE | Admitting: Licensed Clinical Social Worker

## 2017-04-26 ENCOUNTER — Ambulatory Visit: Payer: PRIVATE HEALTH INSURANCE | Admitting: Licensed Clinical Social Worker

## 2017-05-10 ENCOUNTER — Ambulatory Visit (INDEPENDENT_AMBULATORY_CARE_PROVIDER_SITE_OTHER): Payer: PRIVATE HEALTH INSURANCE | Admitting: Licensed Clinical Social Worker

## 2017-05-10 DIAGNOSIS — F3341 Major depressive disorder, recurrent, in partial remission: Secondary | ICD-10-CM

## 2017-05-17 ENCOUNTER — Ambulatory Visit (INDEPENDENT_AMBULATORY_CARE_PROVIDER_SITE_OTHER): Payer: PRIVATE HEALTH INSURANCE | Admitting: Licensed Clinical Social Worker

## 2017-05-17 DIAGNOSIS — F3341 Major depressive disorder, recurrent, in partial remission: Secondary | ICD-10-CM

## 2017-05-24 ENCOUNTER — Ambulatory Visit: Payer: PRIVATE HEALTH INSURANCE | Admitting: Licensed Clinical Social Worker

## 2017-05-31 ENCOUNTER — Ambulatory Visit (INDEPENDENT_AMBULATORY_CARE_PROVIDER_SITE_OTHER): Payer: PRIVATE HEALTH INSURANCE | Admitting: Licensed Clinical Social Worker

## 2017-05-31 DIAGNOSIS — F3341 Major depressive disorder, recurrent, in partial remission: Secondary | ICD-10-CM

## 2017-06-21 ENCOUNTER — Ambulatory Visit: Payer: Self-pay | Admitting: Licensed Clinical Social Worker

## 2017-06-28 ENCOUNTER — Ambulatory Visit (INDEPENDENT_AMBULATORY_CARE_PROVIDER_SITE_OTHER): Payer: PRIVATE HEALTH INSURANCE | Admitting: Licensed Clinical Social Worker

## 2017-06-28 DIAGNOSIS — F3341 Major depressive disorder, recurrent, in partial remission: Secondary | ICD-10-CM | POA: Diagnosis not present

## 2017-07-05 ENCOUNTER — Ambulatory Visit: Payer: Self-pay | Admitting: Licensed Clinical Social Worker

## 2017-07-12 ENCOUNTER — Ambulatory Visit: Payer: Self-pay | Admitting: Licensed Clinical Social Worker

## 2017-07-19 ENCOUNTER — Ambulatory Visit: Payer: Self-pay | Admitting: Licensed Clinical Social Worker

## 2017-07-26 ENCOUNTER — Ambulatory Visit: Payer: Self-pay | Admitting: Licensed Clinical Social Worker

## 2017-07-26 ENCOUNTER — Ambulatory Visit: Payer: PRIVATE HEALTH INSURANCE | Admitting: Licensed Clinical Social Worker

## 2017-08-02 ENCOUNTER — Ambulatory Visit: Payer: Self-pay | Admitting: Licensed Clinical Social Worker

## 2017-08-09 ENCOUNTER — Ambulatory Visit: Payer: Self-pay | Admitting: Licensed Clinical Social Worker

## 2017-08-16 ENCOUNTER — Ambulatory Visit: Payer: PRIVATE HEALTH INSURANCE | Admitting: Licensed Clinical Social Worker

## 2017-08-16 ENCOUNTER — Ambulatory Visit: Payer: Self-pay | Admitting: Licensed Clinical Social Worker

## 2017-08-23 ENCOUNTER — Ambulatory Visit: Payer: PRIVATE HEALTH INSURANCE | Admitting: Licensed Clinical Social Worker

## 2018-05-23 IMAGING — DX DG LUMBAR SPINE COMPLETE 4+V
5 series · 5 of 5 positions shown · non-contrast
Comparison: None.

CLINICAL DATA: Low back pain.  Denies injury.

EXAM:
LUMBAR SPINE - COMPLETE 4+ VIEW

[l-spine ap]
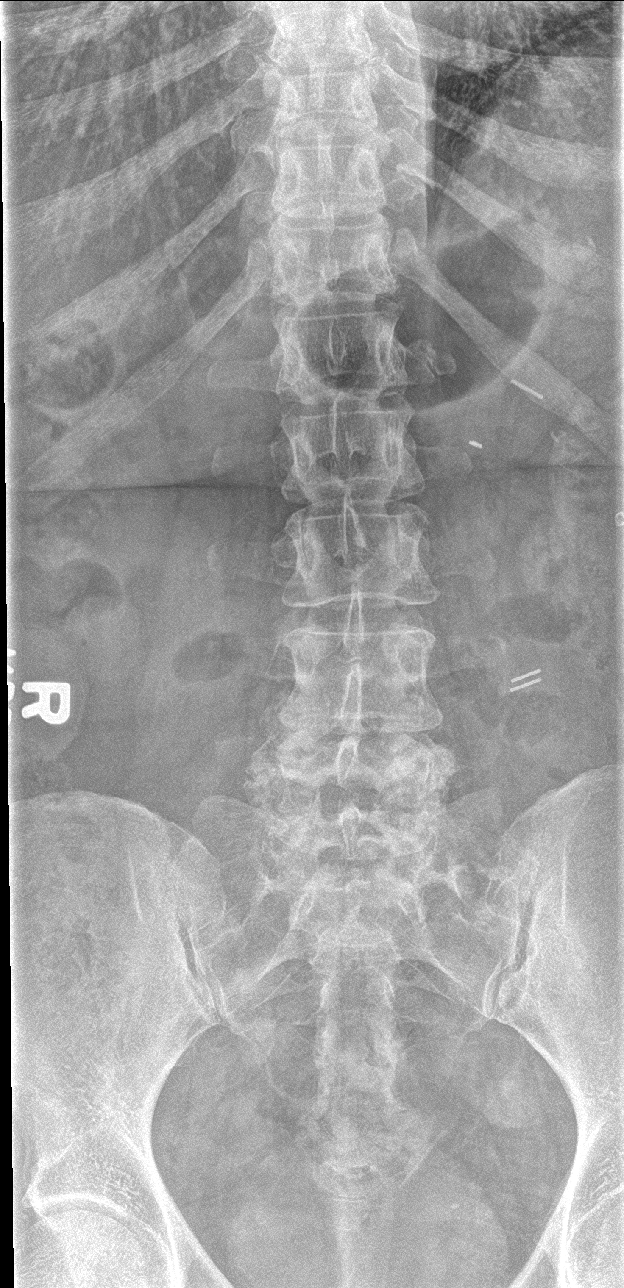

[l-spine obl (1 of 2)]
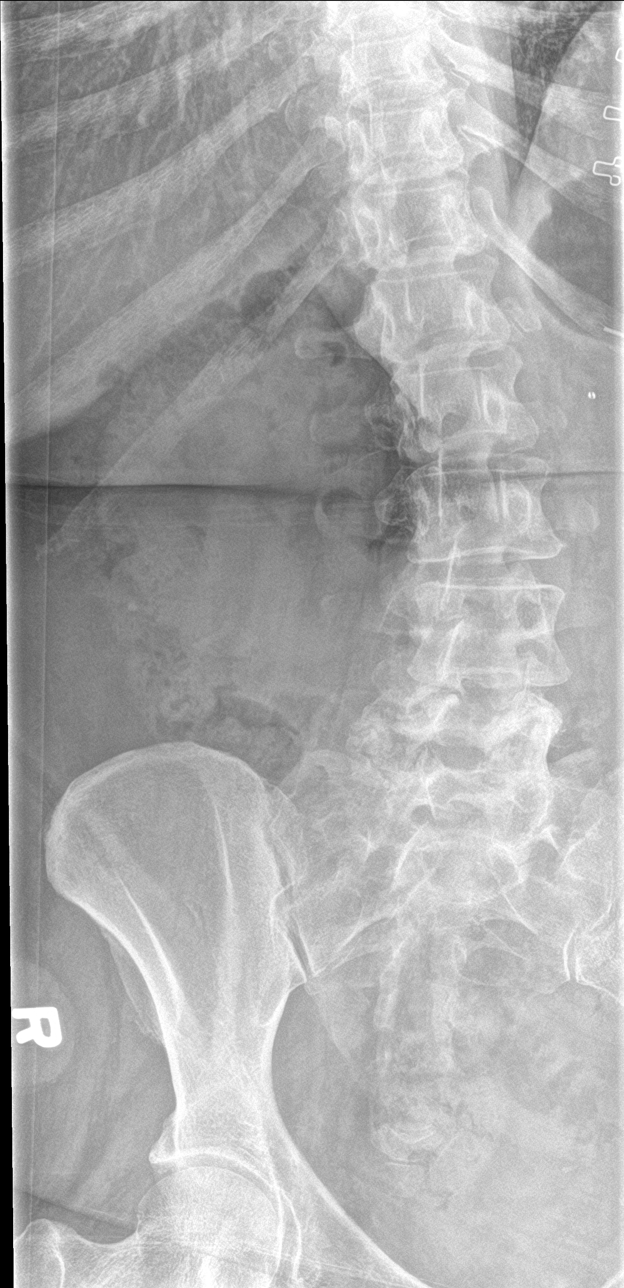

[l-spine obl (2 of 2)]
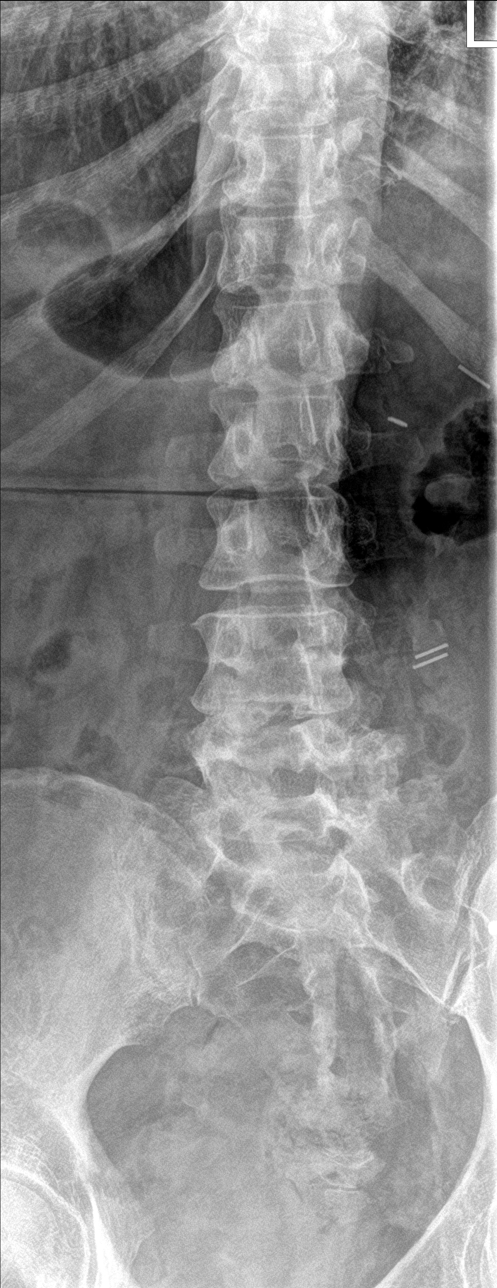

[l-spine lat]
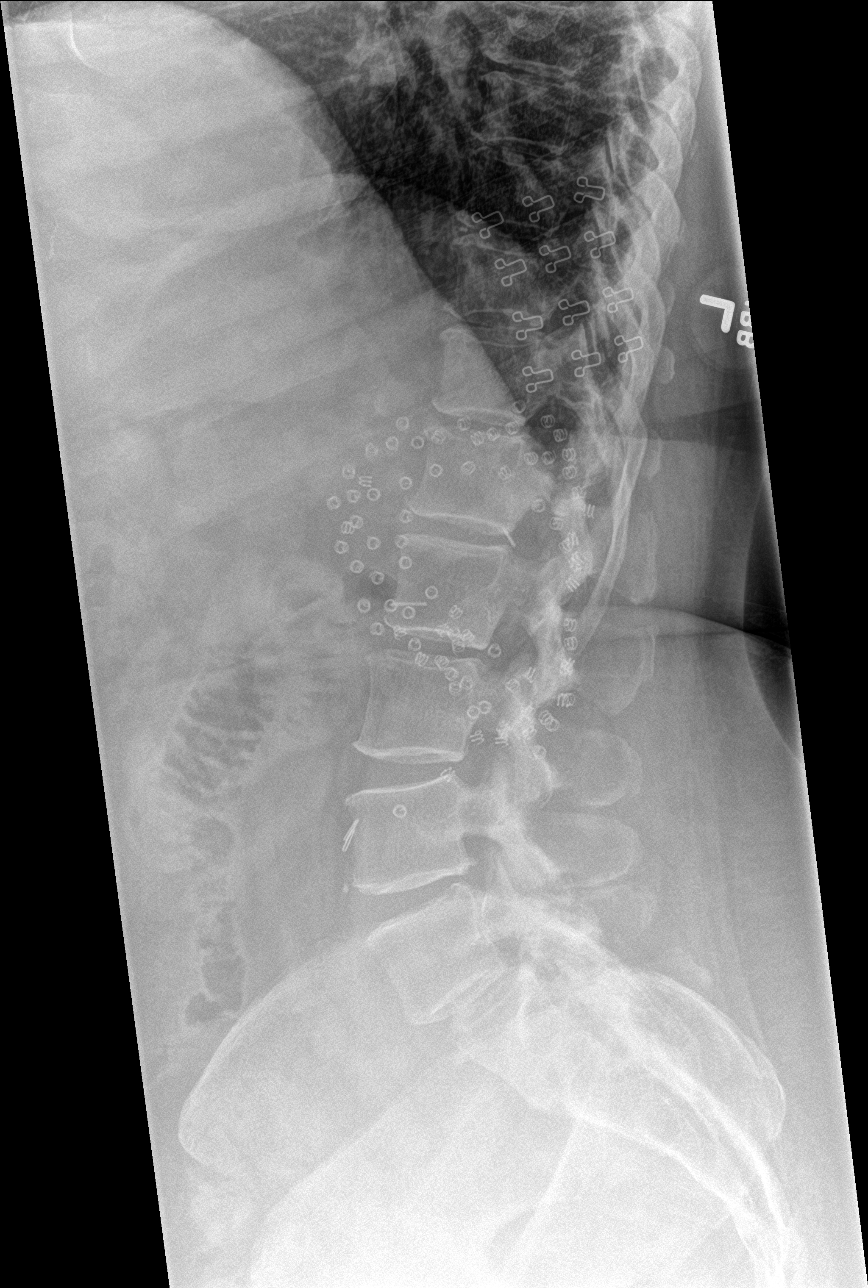

[l-spine spot]
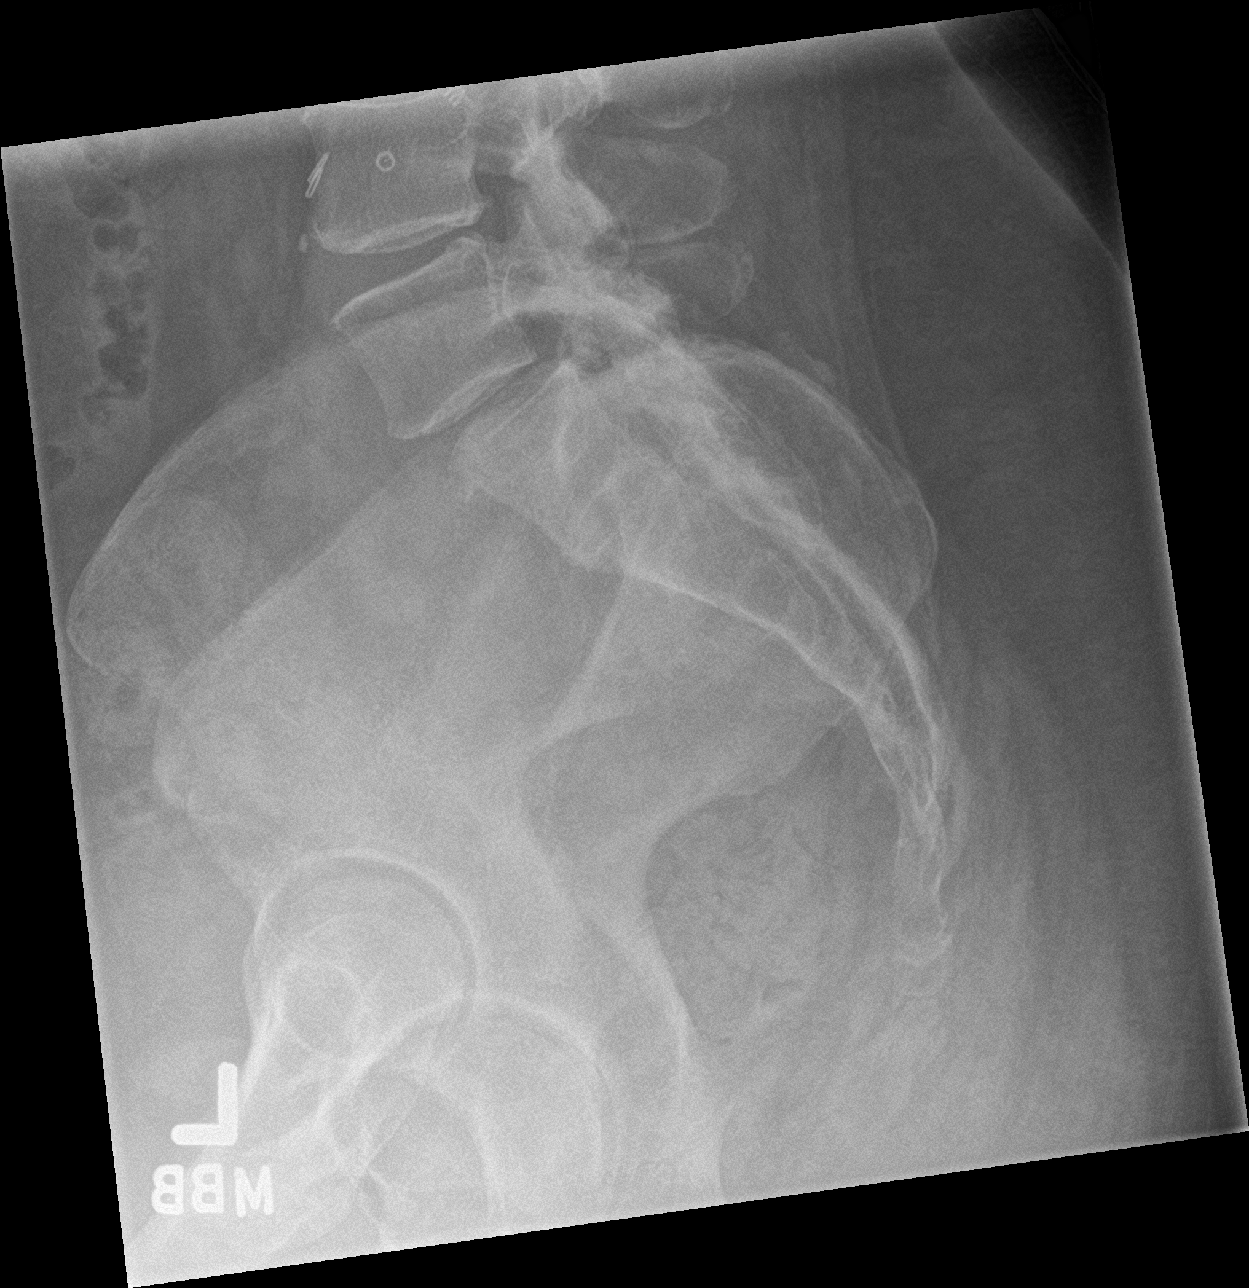

[5 of 5 positions shown; findings below may reference images not displayed]

FINDINGS: Lumbar spine is transitional. Imaging of the thoracic spine will be
required for proper labeling. Be low the last lower thoracic
vertebral body with large ribs, there is a vertebral body with a
rudimentary left-sided rib. At the lumbosacral junction, a
lumbarized vertebral body is fused with the sacrum. There is no
vertebral compression deformity. There is 4 mm anterolisthesis at
the lowest complete disc. There is also considerable facet
arthropathy at the lumbosacral junction. No definite acute fracture.
IMPRESSION: No acute bony pathology.

Degenerative change.

Transitional anatomy as described.

## 2018-07-28 ENCOUNTER — Ambulatory Visit
Admission: RE | Admit: 2018-07-28 | Discharge: 2018-07-28 | Disposition: A | Discharge status: Home / Self Care | Payer: PRIVATE HEALTH INSURANCE | Source: Ambulatory Visit | Attending: Family Medicine | Admitting: Family Medicine

## 2018-07-28 ENCOUNTER — Other Ambulatory Visit: Payer: Self-pay | Admitting: Family Medicine

## 2018-07-28 DIAGNOSIS — R109 Unspecified abdominal pain: Secondary | ICD-10-CM

## 2018-07-31 ENCOUNTER — Other Ambulatory Visit: Payer: Self-pay | Admitting: Family Medicine

## 2018-07-31 DIAGNOSIS — R1032 Left lower quadrant pain: Secondary | ICD-10-CM

## 2018-08-08 ENCOUNTER — Ambulatory Visit
Admission: RE | Admit: 2018-08-08 | Discharge: 2018-08-08 | Disposition: A | Discharge status: Home / Self Care | Payer: PRIVATE HEALTH INSURANCE | Source: Ambulatory Visit | Attending: Family Medicine | Admitting: Family Medicine

## 2018-08-08 ENCOUNTER — Other Ambulatory Visit: Payer: Self-pay | Admitting: Family Medicine

## 2018-08-08 DIAGNOSIS — R1032 Left lower quadrant pain: Secondary | ICD-10-CM

## 2018-08-16 ENCOUNTER — Other Ambulatory Visit: Payer: Self-pay | Admitting: Family Medicine
